# Patient Record
Sex: Male | Born: 1970 | Race: White | Hispanic: No | Marital: Married
Health system: Southern US, Community
[De-identification: ages and names within clinical notes are randomized; demographics above are authoritative.]

---

## 2013-09-21 ENCOUNTER — Other Ambulatory Visit (HOSPITAL_COMMUNITY): Payer: Self-pay | Admitting: "Endocrinology

## 2013-09-21 DIAGNOSIS — E0590 Thyrotoxicosis, unspecified without thyrotoxic crisis or storm: Secondary | ICD-10-CM

## 2013-09-22 ENCOUNTER — Ambulatory Visit (HOSPITAL_COMMUNITY): Payer: Self-pay

## 2013-09-23 ENCOUNTER — Ambulatory Visit (HOSPITAL_COMMUNITY): Payer: Self-pay

## 2013-09-24 ENCOUNTER — Encounter (HOSPITAL_COMMUNITY): Payer: Self-pay

## 2013-09-24 ENCOUNTER — Encounter (HOSPITAL_COMMUNITY)
Admission: RE | Admit: 2013-09-24 | Discharge: 2013-09-24 | Disposition: A | Discharge status: Home / Self Care | Payer: Federal, State, Local not specified - PPO | Source: Ambulatory Visit | Attending: "Endocrinology | Admitting: "Endocrinology

## 2013-09-24 DIAGNOSIS — E0590 Thyrotoxicosis, unspecified without thyrotoxic crisis or storm: Secondary | ICD-10-CM | POA: Insufficient documentation

## 2013-09-24 MED ORDER — SODIUM IODIDE I 131 CAPSULE
12.0000 | Freq: Once | INTRAVENOUS | Status: AC | PRN
  Administered 2013-09-24: 12 via ORAL

## 2013-09-25 ENCOUNTER — Encounter (HOSPITAL_COMMUNITY)
Admission: RE | Admit: 2013-09-25 | Discharge: 2013-09-25 | Disposition: A | Discharge status: Home / Self Care | Payer: Federal, State, Local not specified - PPO | Source: Ambulatory Visit | Attending: "Endocrinology | Admitting: "Endocrinology

## 2013-09-25 MED ORDER — SODIUM PERTECHNETATE TC 99M INJECTION
10.0000 | Freq: Once | INTRAVENOUS | Status: AC | PRN
  Administered 2013-09-25: 10 via INTRAVENOUS

## 2013-09-28 ENCOUNTER — Other Ambulatory Visit (HOSPITAL_COMMUNITY): Payer: Self-pay | Admitting: "Endocrinology

## 2013-09-28 DIAGNOSIS — E0500 Thyrotoxicosis with diffuse goiter without thyrotoxic crisis or storm: Secondary | ICD-10-CM

## 2013-10-02 ENCOUNTER — Encounter (HOSPITAL_COMMUNITY)
Admission: RE | Admit: 2013-10-02 | Discharge: 2013-10-02 | Disposition: A | Discharge status: Home / Self Care | Payer: Federal, State, Local not specified - PPO | Source: Ambulatory Visit | Attending: "Endocrinology | Admitting: "Endocrinology

## 2013-10-02 DIAGNOSIS — E0500 Thyrotoxicosis with diffuse goiter without thyrotoxic crisis or storm: Secondary | ICD-10-CM

## 2015-07-11 IMAGING — NM NM THYROID IMAGING W/ UPTAKE SINGLE (24 HR)
4 series · 4 of 4 positions shown · non-contrast
Comparison: none

CLINICAL DATA: Hyperthyroid symptoms.

EXAM:
THYROID SCAN AND UPTAKE - 24 HOURS
TECHNIQUE: Following the per oral administration of 6-959 sodium iodide, the
patient returned at 24 hours and uptake measurements were acquired
with the uptake probe centered on the neck. Thyroid imaging was
performed following the intravenous administration of the Pc-DDm
Pertechnetate.
RADIOPHARMACEUTICALS:  12.0 microCuries 6-959 Sodium Iodide and
mCi UJ-SSm Pertechnetate

[Series 1: ant w marker · 3.25mm/px · 1 of 1 slices shown]
[im 1/1]
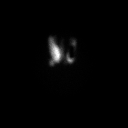

[Series 2: anterior · 3.25mm/px · 1 of 1 slices shown]
[im 1/1]
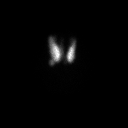

[Series 3: lao · 3.25mm/px · 1 of 1 slices shown]
[im 1/1]
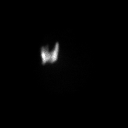

[Series 4: rao · 3.25mm/px · 1 of 1 slices shown]
[im 1/1]
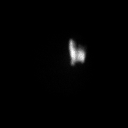

[4 of 4 positions shown; findings below may reference images not displayed]

FINDINGS: There is uniform increased uptake within the thyroid gland is mildly
enlarged. The pyramidal lobe is evident.

24 hour I 131 uptake = 57.7% (normal 10-30%)
IMPRESSION: 1. Imaging findings and 24 Sing[address] Purage 131 uptake are consistent with
Graves disease.
2.  24 hour I 131 uptake = 57.7% (normal 10-30%)
